# Patient Record
Sex: Male | Born: 1995 | Race: Black or African American | Hispanic: No | Marital: Single | Smoking: Never smoker
Health system: Southern US, Community
[De-identification: ages and names within clinical notes are randomized; demographics above are authoritative.]

## PROBLEM LIST (undated history)

## (undated) DIAGNOSIS — I10 Essential (primary) hypertension: Secondary | ICD-10-CM

## (undated) DIAGNOSIS — M93262 Osteochondritis dissecans, left knee: Secondary | ICD-10-CM

## (undated) HISTORY — PX: TONSILLECTOMY: SUR1361

---
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
## 2016-05-08 IMAGING — MR MRI OF THE LEFT KNEE WITHOUT CONTRAST
5 series · 36 of 40 positions shown · non-contrast
Comparison: 05/21/2010

CLINICAL DATA: No known injury. Feels like something is moving
under the skin.

EXAM:
MRI OF THE LEFT KNEE WITHOUT CONTRAST
TECHNIQUE: Multiplanar, multisequence MR imaging of the knee was performed. No
intravenous contrast was administered.

[Series 3: PD fat-sat · axial · 3.0mm · 0.50mm/px · z∈[-67,+62]mm · 11 of 40 slices shown (1 of 3)]
[im 1/40]
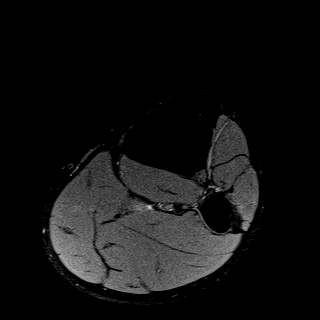
[im 4/40]
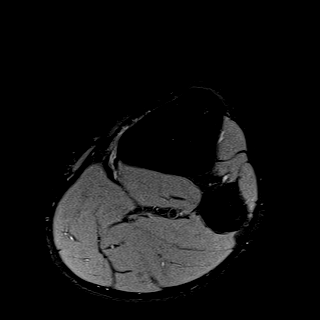
[im 8/40]
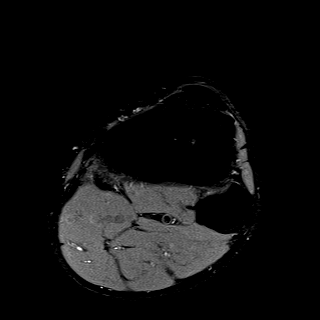
[im 12/40]
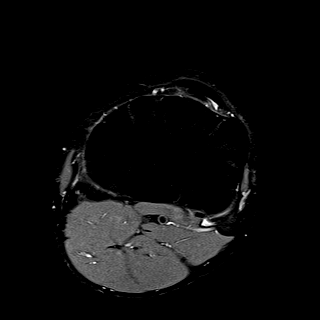
[im 16/40]
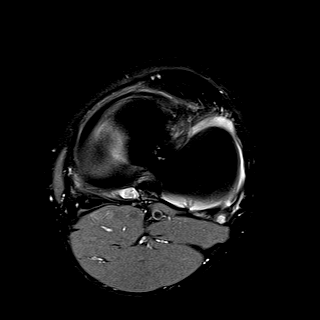
[im 20/40]
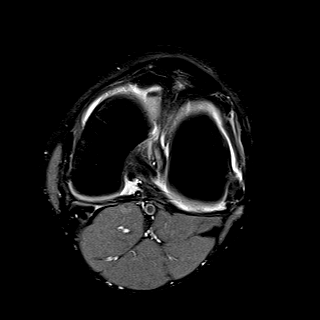
[im 24/40]
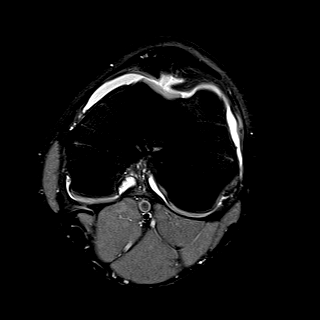
[im 28/40]
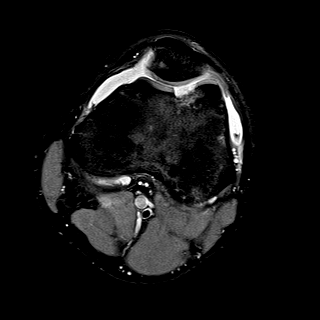
[im 32/40]
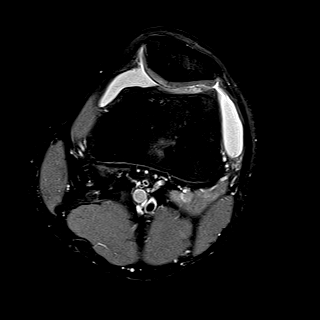
[im 36/40]
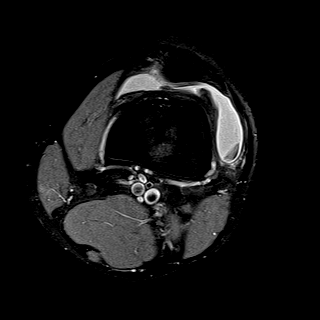
[im 40/40]
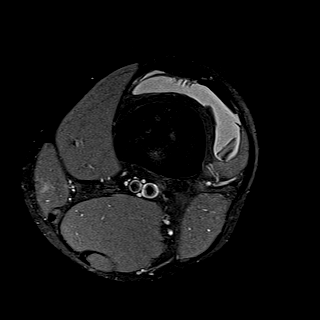

[Series 4: T1 · coronal · 3.0mm · 0.50mm/px · 3 of 30 slices shown]
[im 1/30]
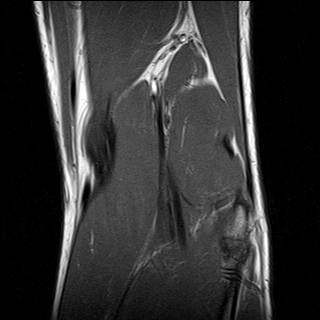
[im 5/30]
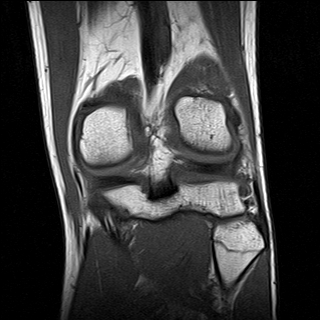
[im 10/30]
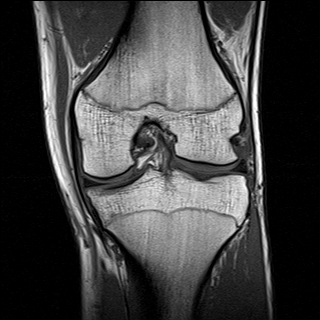

[Series 5: T2 fat-sat · coronal · 3.0mm · 0.31mm/px · 7 of 30 slices shown]
[im 1/30]
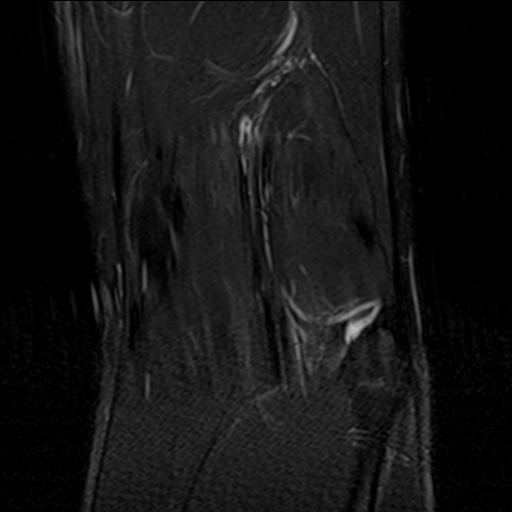
[im 5/30]
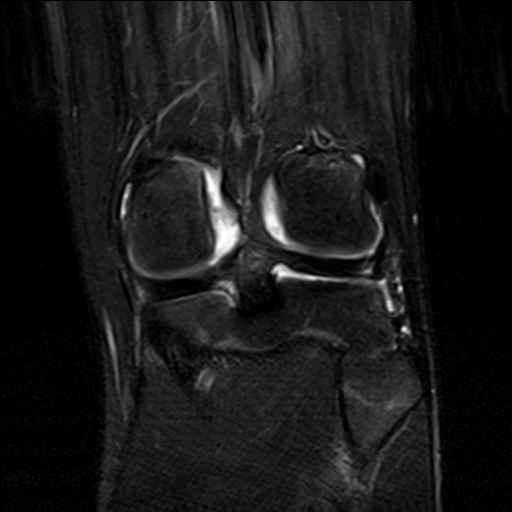
[im 10/30]
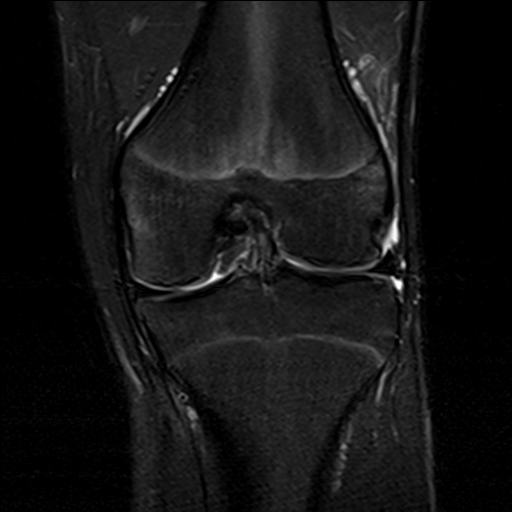
[im 15/30]
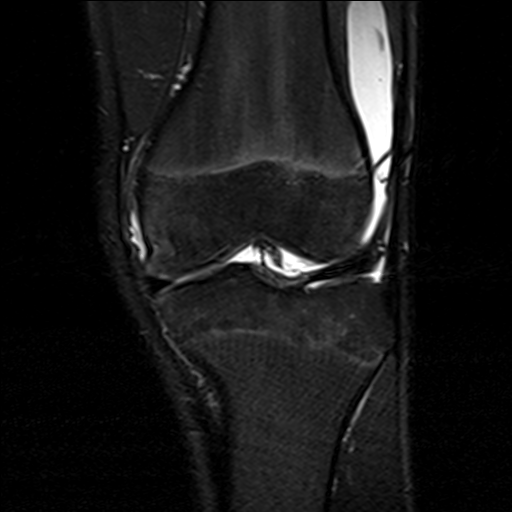
[im 20/30]
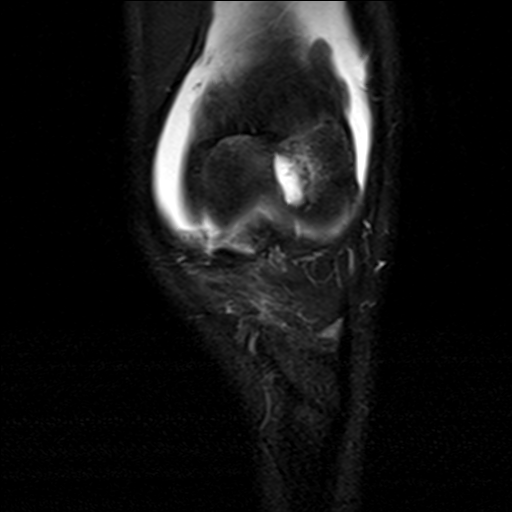
[im 25/30]
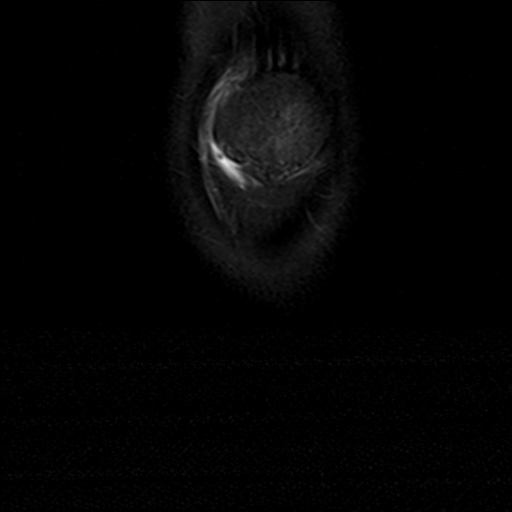
[im 30/30]
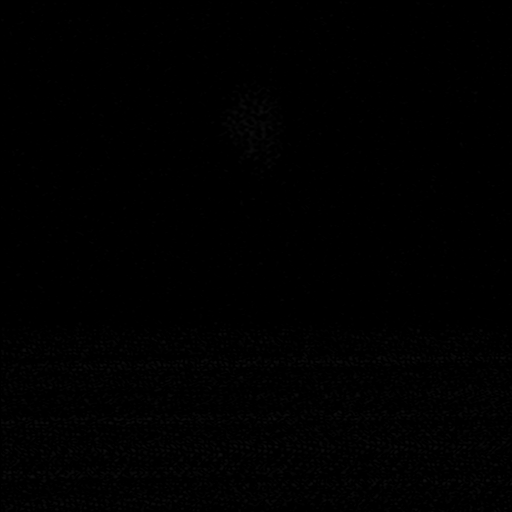

[Series 6: PD fat-sat · coronal · 3.0mm · 0.50mm/px · 7 of 30 slices shown (2 of 3)]
[im 1/30]
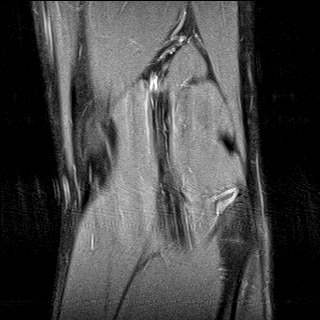
[im 5/30]
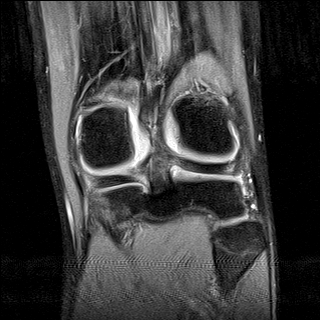
[im 10/30]
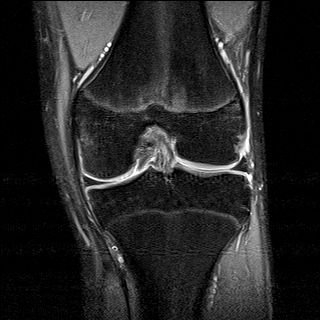
[im 15/30]
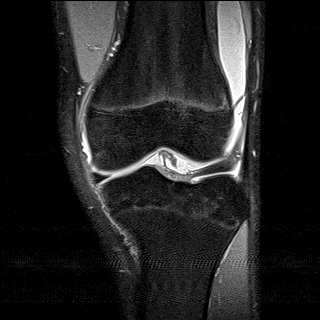
[im 20/30]
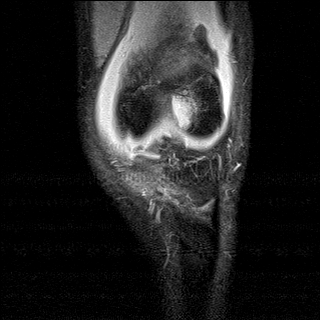
[im 25/30]
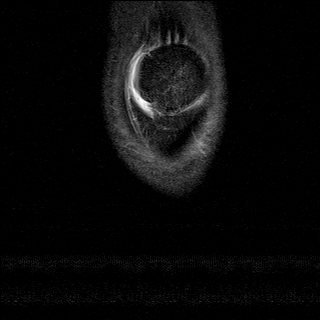
[im 30/30]
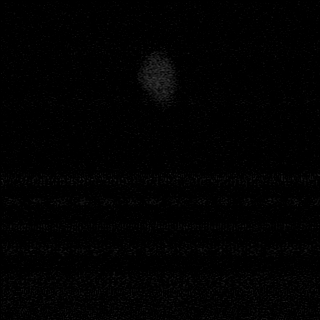

[Series 7: PD fat-sat · sagittal · 3.0mm · 0.50mm/px · 8 of 33 slices shown (3 of 3)]
[im 1/33]
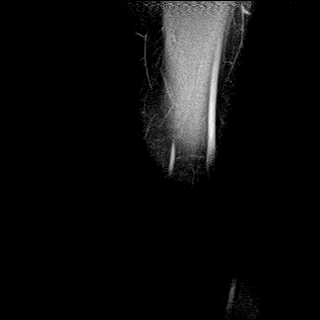
[im 5/33]
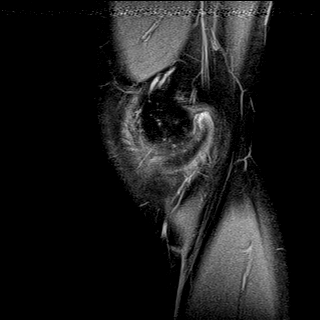
[im 10/33]
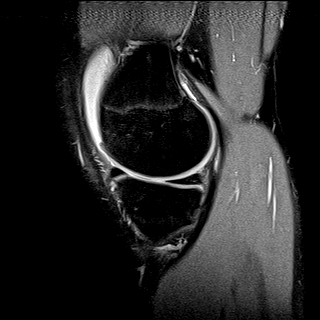
[im 14/33]
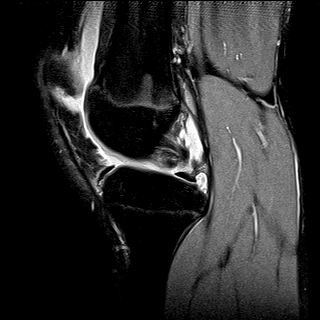
[im 19/33]
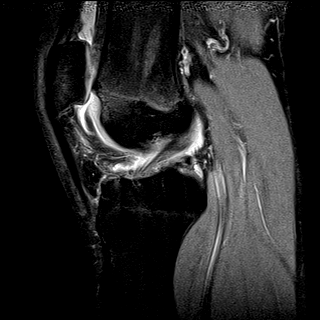
[im 23/33]
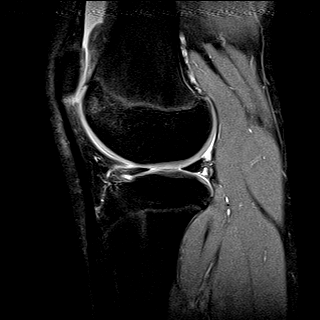
[im 28/33]
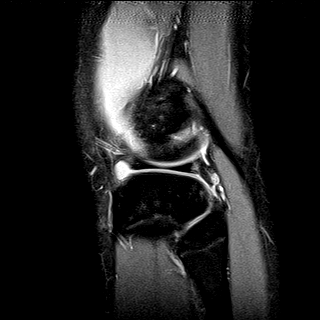
[im 33/33]
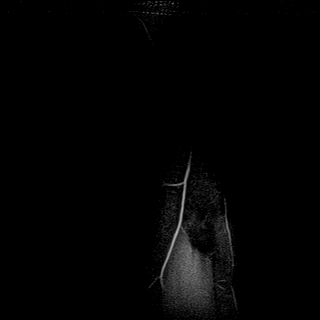

[36 of 40 positions shown; findings below may reference images not displayed]

FINDINGS: MENISCI

Medial meniscus:  Intact.

Lateral meniscus:  Intact.

LIGAMENTS

Cruciates:  Intact ACL and PCL.

Collaterals: Medial collateral ligament is intact. Lateral
collateral ligament complex is intact.

CARTILAGE

Patellofemoral: 7 x 13 x 20 mm displaced osteochondral defect
involving the lateral trochlea with mild surrounding marrow edema.
The fragment is displaced and located in the anterolateral superior
joint space.

Medial:  No chondral defect.

Lateral:  No chondral defect.

Joint: Large joint effusion. Mild edema in Hoffa's fat. No plical
thickening.

Popliteal Fossa:  No Baker cyst.  Intact popliteus tendon.

Extensor Mechanism:  Intact.

Bones:  No other focal marrow signal abnormality.
IMPRESSION: 1. Displaced 7 x 13 x 20 mm osteochondral defect involving the
lateral trochlea with the displaced fragment located in the
anterolateral superior joint space.

## 2019-01-05 ENCOUNTER — Other Ambulatory Visit: Payer: Self-pay | Admitting: Sports Medicine

## 2019-01-05 DIAGNOSIS — G8929 Other chronic pain: Secondary | ICD-10-CM

## 2019-01-05 DIAGNOSIS — M25561 Pain in right knee: Principal | ICD-10-CM

## 2019-01-25 ENCOUNTER — Ambulatory Visit
Admission: RE | Admit: 2019-01-25 | Discharge: 2019-01-25 | Disposition: A | Discharge status: Home / Self Care | Payer: No Typology Code available for payment source | Source: Ambulatory Visit | Attending: Sports Medicine | Admitting: Sports Medicine

## 2019-01-25 DIAGNOSIS — G8929 Other chronic pain: Secondary | ICD-10-CM | POA: Insufficient documentation

## 2019-01-25 DIAGNOSIS — M25561 Pain in right knee: Secondary | ICD-10-CM | POA: Diagnosis not present

## 2019-03-25 ENCOUNTER — Other Ambulatory Visit
Admission: RE | Admit: 2019-03-25 | Discharge: 2019-03-25 | Disposition: A | Discharge status: Home / Self Care | Payer: No Typology Code available for payment source | Source: Ambulatory Visit | Attending: Surgery | Admitting: Surgery

## 2019-03-25 DIAGNOSIS — Z1159 Encounter for screening for other viral diseases: Secondary | ICD-10-CM | POA: Diagnosis not present

## 2019-03-26 LAB — NOVEL CORONAVIRUS, NAA (HOSP ORDER, SEND-OUT TO REF LAB; TAT 18-24 HRS): SARS-CoV-2, NAA: NOT DETECTED

## 2019-03-28 ENCOUNTER — Encounter: Payer: Self-pay | Admitting: *Deleted

## 2019-03-28 ENCOUNTER — Other Ambulatory Visit: Payer: Self-pay

## 2019-03-30 ENCOUNTER — Encounter
Admission: RE | Disposition: A | Discharge status: Home / Self Care | Payer: Self-pay | Source: Home / Self Care | Attending: Surgery

## 2019-03-30 ENCOUNTER — Ambulatory Visit: Payer: No Typology Code available for payment source | Admitting: Anesthesiology

## 2019-03-30 ENCOUNTER — Ambulatory Visit
Admission: RE | Admit: 2019-03-30 | Discharge: 2019-03-30 | Disposition: A | Discharge status: Home / Self Care | Payer: No Typology Code available for payment source | Attending: Surgery | Admitting: Surgery

## 2019-03-30 DIAGNOSIS — S83241A Other tear of medial meniscus, current injury, right knee, initial encounter: Secondary | ICD-10-CM | POA: Diagnosis present

## 2019-03-30 DIAGNOSIS — M238X1 Other internal derangements of right knee: Secondary | ICD-10-CM | POA: Diagnosis not present

## 2019-03-30 HISTORY — PX: KNEE ARTHROSCOPY WITH MEDIAL MENISECTOMY: SHX5651

## 2019-03-30 SURGERY — ARTHROSCOPY, KNEE, WITH MEDIAL MENISCECTOMY
Anesthesia: General | Site: Knee | Laterality: Right

## 2019-03-30 MED ORDER — DEXTROSE 5 % IV SOLN
2000.0000 mg | Freq: Once | INTRAVENOUS | Status: AC
  Administered 2019-03-30: 13:00:00 2000 mg via INTRAVENOUS

## 2019-03-30 MED ORDER — ACETAMINOPHEN 160 MG/5ML PO SOLN: 325.0000 mg | ORAL | Status: DC | PRN

## 2019-03-30 MED ORDER — GLYCOPYRROLATE 0.2 MG/ML IJ SOLN
INTRAMUSCULAR | Status: DC | PRN
  Administered 2019-03-30: 0.1 mg via INTRAVENOUS

## 2019-03-30 MED ORDER — ONDANSETRON HCL 4 MG/2ML IJ SOLN: 4.0000 mg | Freq: Once | INTRAMUSCULAR | Status: DC | PRN

## 2019-03-30 MED ORDER — LIDOCAINE HCL (CARDIAC) PF 100 MG/5ML IV SOSY
PREFILLED_SYRINGE | INTRAVENOUS | Status: DC | PRN
  Administered 2019-03-30: 40 mg via INTRATRACHEAL

## 2019-03-30 MED ORDER — FENTANYL CITRATE (PF) 100 MCG/2ML IJ SOLN
INTRAMUSCULAR | Status: DC | PRN
  Administered 2019-03-30: 100 ug via INTRAVENOUS

## 2019-03-30 MED ORDER — SCOPOLAMINE 1 MG/3DAYS TD PT72
1.0000 | MEDICATED_PATCH | Freq: Once | TRANSDERMAL | Status: DC
  Administered 2019-03-30: 1.5 mg via TRANSDERMAL

## 2019-03-30 MED ORDER — LIDOCAINE-EPINEPHRINE 1 %-1:100000 IJ SOLN
INTRAMUSCULAR | Status: DC | PRN
  Administered 2019-03-30: 25 mL
  Administered 2019-03-30: 20 mL

## 2019-03-30 MED ORDER — OXYCODONE HCL 5 MG/5ML PO SOLN: 5.0000 mg | Freq: Once | ORAL | Status: DC | PRN

## 2019-03-30 MED ORDER — OXYCODONE HCL 5 MG PO TABS: 5.0000 mg | ORAL_TABLET | ORAL | 0 refills | Status: DC | PRN

## 2019-03-30 MED ORDER — ACETAMINOPHEN 325 MG PO TABS: 325.0000 mg | ORAL_TABLET | ORAL | Status: DC | PRN

## 2019-03-30 MED ORDER — LACTATED RINGERS IV SOLN
INTRAVENOUS | Status: DC
  Administered 2019-03-30 (×2): via INTRAVENOUS

## 2019-03-30 MED ORDER — BUPIVACAINE HCL (PF) 0.5 % IJ SOLN
INTRAMUSCULAR | Status: DC | PRN
  Administered 2019-03-30: 25 mL

## 2019-03-30 MED ORDER — DEXAMETHASONE SODIUM PHOSPHATE 4 MG/ML IJ SOLN
INTRAMUSCULAR | Status: DC | PRN
  Administered 2019-03-30: 4 mg via INTRAVENOUS

## 2019-03-30 MED ORDER — OXYCODONE HCL 5 MG PO TABS: 5.0000 mg | ORAL_TABLET | Freq: Once | ORAL | Status: DC | PRN

## 2019-03-30 MED ORDER — ONDANSETRON HCL 4 MG/2ML IJ SOLN
INTRAMUSCULAR | Status: DC | PRN
  Administered 2019-03-30: 4 mg via INTRAVENOUS

## 2019-03-30 MED ORDER — FENTANYL CITRATE (PF) 100 MCG/2ML IJ SOLN: 25.0000 ug | INTRAMUSCULAR | Status: DC | PRN

## 2019-03-30 MED ORDER — MIDAZOLAM HCL 5 MG/5ML IJ SOLN
INTRAMUSCULAR | Status: DC | PRN
  Administered 2019-03-30: 2 mg via INTRAVENOUS

## 2019-03-30 MED ORDER — PROPOFOL 10 MG/ML IV BOLUS
INTRAVENOUS | Status: DC | PRN
  Administered 2019-03-30: 160 mg via INTRAVENOUS

## 2019-03-30 SURGICAL SUPPLY — 32 items
ADAPTER IRRIG TUBE 2 SPIKE SOL (ADAPTER) ×3 IMPLANT
BANDAGE ACE 6X5 VEL STRL LF (GAUZE/BANDAGES/DRESSINGS) ×3 IMPLANT
BLADE FULL RADIUS 3.5 (BLADE) ×3 IMPLANT
BLADE SURG SZ11 CARB STEEL (BLADE) ×3 IMPLANT
BNDG COHESIVE 4X5 TAN STRL (GAUZE/BANDAGES/DRESSINGS) ×3 IMPLANT
BNDG ESMARK 6X12 TAN STRL LF (GAUZE/BANDAGES/DRESSINGS) ×3 IMPLANT
BUR ACROMIONIZER 4.0 (BURR) ×3 IMPLANT
CHLORAPREP W/TINT 26ML (MISCELLANEOUS) ×3 IMPLANT
COVER LIGHT HANDLE UNIVERSAL (MISCELLANEOUS) ×6 IMPLANT
CUFF TOURN SGL QUICK 30 (MISCELLANEOUS) ×2
CUFF TRNQT CYL LO 30X4X (MISCELLANEOUS) ×1 IMPLANT
DRAPE IMP U-DRAPE 54X76 (DRAPES) ×3 IMPLANT
DRAPE U-SHAPE 48X52 POLY STRL (PACKS) ×3 IMPLANT
GAUZE SPONGE 4X4 12PLY STRL (GAUZE/BANDAGES/DRESSINGS) ×3 IMPLANT
GLOVE BIO SURGEON STRL SZ7.5 (GLOVE) ×3 IMPLANT
GLOVE BIOGEL PI IND STRL 8 (GLOVE) ×1 IMPLANT
GLOVE BIOGEL PI INDICATOR 8 (GLOVE) ×2
GOWN STRL REUS W/ TWL LRG LVL3 (GOWN DISPOSABLE) ×1 IMPLANT
GOWN STRL REUS W/ TWL XL LVL3 (GOWN DISPOSABLE) ×1 IMPLANT
GOWN STRL REUS W/TWL LRG LVL3 (GOWN DISPOSABLE) ×2
GOWN STRL REUS W/TWL XL LVL3 (GOWN DISPOSABLE) ×2
IV LACTATED RINGER IRRG 3000ML (IV SOLUTION) ×4
IV LR IRRIG 3000ML ARTHROMATIC (IV SOLUTION) ×2 IMPLANT
KIT TURNOVER KIT A (KITS) ×3 IMPLANT
MAT ABSORB  FLUID 56X50 GRAY (MISCELLANEOUS) ×2
MAT ABSORB FLUID 56X50 GRAY (MISCELLANEOUS) ×1 IMPLANT
NEEDLE HYPO 22GX1.5 SAFETY (NEEDLE) ×3 IMPLANT
NEPTUNE MANIFOLD (MISCELLANEOUS) ×3 IMPLANT
NS IRRIG 500ML POUR BTL (IV SOLUTION) ×3 IMPLANT
PACK ARTHROSCOPY KNEE (MISCELLANEOUS) ×3 IMPLANT
SUT PROLENE 4 0 PS 2 18 (SUTURE) ×3 IMPLANT
TUBING ARTHRO INFLOW-ONLY STRL (TUBING) ×3 IMPLANT

## 2019-03-30 NOTE — Anesthesia Preprocedure Evaluation (Signed)
Anesthesia Evaluation  Patient identified by MRN, date of birth, ID band Patient awake    Reviewed: Allergy & Precautions, H&P , NPO status , Patient's Chart, lab work & pertinent test results  Airway Mallampati: I  TM Distance: >3 FB Neck ROM: full    Dental no notable dental hx.    Pulmonary    Pulmonary exam normal breath sounds clear to auscultation       Cardiovascular Normal cardiovascular exam Rhythm:regular Rate:Normal     Neuro/Psych    GI/Hepatic   Endo/Other    Renal/GU      Musculoskeletal   Abdominal   Peds  Hematology   Anesthesia Other Findings   Reproductive/Obstetrics                             Anesthesia Physical Anesthesia Plan  ASA: I  Anesthesia Plan: General LMA   Post-op Pain Management:    Induction:   PONV Risk Score and Plan: 2 and Midazolam, Scopolamine patch - Pre-op, Ondansetron, Dexamethasone and Treatment may vary due to age or medical condition  Airway Management Planned:   Additional Equipment:   Intra-op Plan:   Post-operative Plan:   Informed Consent: I have reviewed the patients History and Physical, chart, labs and discussed the procedure including the risks, benefits and alternatives for the proposed anesthesia with the patient or authorized representative who has indicated his/her understanding and acceptance.       Plan Discussed with: CRNA  Anesthesia Plan Comments:         Anesthesia Quick Evaluation

## 2019-03-30 NOTE — Transfer of Care (Signed)
Immediate Anesthesia Transfer of Care Note  Patient: Clinton Ramirez  Procedure(s) Performed: KNEE ARTHROSCOPY WITH PARTIAL MEDIAL MENISECTOMYAND REPAIR OF ARTICULAR CARTILAGE LESION OF FEMORAL TROCHLEA THROUGH ARTHROTOMY (Right Knee)  Patient Location: PACU  Anesthesia Type: General LMA  Level of Consciousness: awake, alert  and patient cooperative  Airway and Oxygen Therapy: Patient Spontanous Breathing and Patient connected to supplemental oxygen  Post-op Assessment: Post-op Vital signs reviewed, Patient's Cardiovascular Status Stable, Respiratory Function Stable, Patent Airway and No signs of Nausea or vomiting  Post-op Vital Signs: Reviewed and stable  Complications: No apparent anesthesia complications

## 2019-03-30 NOTE — H&P (Signed)
Paper H&P to be scanned into permanent record. H&P reviewed and patient re-examined. No changes. 

## 2019-03-30 NOTE — Anesthesia Procedure Notes (Signed)
Procedure Name: LMA Insertion Date/Time: 03/30/2019 1:50 PM Performed by: Jimmy Picket, CRNA Pre-anesthesia Checklist: Patient identified, Emergency Drugs available, Suction available, Timeout performed and Patient being monitored Patient Re-evaluated:Patient Re-evaluated prior to induction Oxygen Delivery Method: Circle system utilized Preoxygenation: Pre-oxygenation with 100% oxygen Induction Type: IV induction LMA: LMA inserted LMA Size: 4.0 Number of attempts: 1 Placement Confirmation: positive ETCO2 and breath sounds checked- equal and bilateral Tube secured with: Tape

## 2019-03-30 NOTE — Anesthesia Postprocedure Evaluation (Signed)
Anesthesia Post Note  Patient: Clinton Ramirez  Procedure(s) Performed: KNEE ARTHROSCOPY WITH PARTIAL MEDIAL MENISECTOMYAND REPAIR OF ARTICULAR CARTILAGE LESION OF FEMORAL TROCHLEA THROUGH ARTHROTOMY (Right Knee)  Patient location during evaluation: PACU Anesthesia Type: General Level of consciousness: awake and alert and oriented Pain management: satisfactory to patient Vital Signs Assessment: post-procedure vital signs reviewed and stable Respiratory status: spontaneous breathing, nonlabored ventilation and respiratory function stable Cardiovascular status: blood pressure returned to baseline and stable Postop Assessment: Adequate PO intake and No signs of nausea or vomiting Anesthetic complications: no    Cherly [place]

## 2019-03-30 NOTE — Discharge Instructions (Signed)
General Anesthesia, Adult, Care After °This sheet gives you information about how to care for yourself after your procedure. Your health care provider may also give you more specific instructions. If you have problems or questions, contact your health care provider. °What can I expect after the procedure? °After the procedure, the following side effects are common: °· Pain or discomfort at the IV site. °· Nausea. °· Vomiting. °· Sore throat. °· Trouble concentrating. °· Feeling cold or chills. °· Weak or tired. °· Sleepiness and fatigue. °· Soreness and body aches. These side effects can affect parts of the body that were not involved in surgery. °Follow these instructions at home: ° °For at least 24 hours after the procedure: °· Have a responsible adult stay with you. It is important to have someone help care for you until you are awake and alert. °· Rest as needed. °· Do not: °? Participate in activities in which you could fall or become injured. °? Drive. °? Use heavy machinery. °? Drink alcohol. °? Take sleeping pills or medicines that cause drowsiness. °? Make important decisions or sign legal documents. °? Take care of children on your own. °Eating and drinking °· Follow any instructions from your health care provider about eating or drinking restrictions. °· When you feel hungry, start by eating small amounts of foods that are soft and easy to digest (bland), such as toast. Gradually return to your regular diet. °· Drink enough fluid to keep your urine pale yellow. °· If you vomit, rehydrate by drinking water, juice, or clear broth. °General instructions °· If you have sleep apnea, surgery and certain medicines can increase your risk for breathing problems. Follow instructions from your health care provider about wearing your sleep device: °? Anytime you are sleeping, including during daytime naps. °? While taking prescription pain medicines, sleeping medicines, or medicines that make you drowsy. °· Return to  your normal activities as told by your health care provider. Ask your health care provider what activities are safe for you. °· Take over-the-counter and prescription medicines only as told by your health care provider. °· If you smoke, do not smoke without supervision. °· Keep all follow-up visits as told by your health care provider. This is important. °Contact a health care provider if: °· You have nausea or vomiting that does not get better with medicine. °· You cannot eat or drink without vomiting. °· You have pain that does not get better with medicine. °· You are unable to pass urine. °· You develop a skin rash. °· You have a fever. °· You have redness around your IV site that gets worse. °Get help right away if: °· You have difficulty breathing. °· You have chest pain. °· You have blood in your urine or stool, or you vomit blood. °Summary °· After the procedure, it is common to have a sore throat or nausea. It is also common to feel tired. °· Have a responsible adult stay with you for the first 24 hours after general anesthesia. It is important to have someone help care for you until you are awake and alert. °· When you feel hungry, start by eating small amounts of foods that are soft and easy to digest (bland), such as toast. Gradually return to your regular diet. °· Drink enough fluid to keep your urine pale yellow. °· Return to your normal activities as told by your health care provider. Ask your health care provider what activities are safe for you. °This information is not   intended to replace advice given to you by your health care provider. Make sure you discuss any questions you have with your health care provider. °Document Released: 02/09/2001 Document Revised: 06/19/2017 Document Reviewed: 06/19/2017 °Elsevier Interactive Patient Education © 2019 [employer] ° ° °Orthopedic discharge instructions: °Keep dressing dry and intact.  °May shower after dressing changed on post-op day #4 (Sunday).    °Cover sutures with Band-Aids after drying off. °Apply ice frequently to knee. °Take ibuprofen 600-800 mg TID with meals for 7-10 days, then as necessary. °Take pain medication as prescribed or ES Tylenol when needed.  °May weight-bear as tolerated - use crutches or walker as needed. °Follow-up in 10-14 days or as scheduled. °

## 2019-03-30 NOTE — Op Note (Signed)
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  Postoperative diagnosis:   Focal full-thickness articular cartilage defect of lateral femoral cochlea, right knee.  Procedure:   Arthroscopic debridement with abrasion chondroplasty and microfracture of focal full-thickness articular cartilage defect of the lateral femoral trochlea, right knee.  Surgeon:   Maryagnes Amos, M.D.  Anesthesia:   General LMA.  Findings:   As above.  There was an area of full-thickness articular cartilage loss involving the lateral portion of the femoral trochlea and measuring approximately 0.7 x 1.3 cm.  The remaining portions of the articular surface all were in excellent condition, as were the anterior and posterior cruciate ligaments.  The medial and lateral menisci both were in excellent condition as well.  Complications:   None.  EBL:   <5 cc.  Total fluids:   1000 cc of crystalloid.  Tourniquet time:   None  Drains:   None  Closure:   4-0 Prolene interrupted sutures.  Brief clinical note:   The patient is a 23 year old male with an approximately 6 to 10-month history of right knee pain. His symptoms have persisted despite medications, activity modification, etc. His history and examination are consistent with a possible medial meniscus tear. An MRI scan demonstrated a focal full-thickness articular cartilage defect involving the lateral portion of the femoral trochlea, as well as a possible small medial meniscus tear. The patient presents at this time for arthroscopy, debridement, microfracture versus allograft de novo cartilage transplantation, and possible partial medial meniscectomy.  Procedure:   The patient was brought into the operating room and lain in the supine position. After adequate general laryngeal mask anesthesia was obtained, a timeout was  performed to verify the appropriate side. The patient's right knee was injected sterilely using a solution of 25 cc of 1% lidocaine with epinephrine and 25 cc of 0.5% Sensorcaine. The right lower extremity was prepped with ChloraPrep solution before being draped sterilely. Preoperative antibiotics were administered. The expected portal sites were injected with 1% lidocaine with epinephrine before the camera was placed in the anterolateral portal and instrumentation performed through the anteromedial portal. The knee was sequentially examined beginning in the suprapatellar pouch, then progressing to the patellofemoral space, the medial gutter compartment, the notch, and finally the lateral compartment and gutter. The findings were as described above. Abundant reactive synovial tissues anteriorly were debrided using the full-radius resector in order to improve visualization.   The area of articular cartilage damage was probed and measured. It was felt that the area was small enough to permit successful microfracture. Therefore, the edges and base of the lesion were carefully curetted using a ring curette before the subchondral bone was perforated numerous times using an arthroscopic awl, maintaining approximately 4 mm of distance between each perforation. The inflow was briefly stopped to verify that adequate bleeding into the defect had been achieved. Once this was confirmed, the instruments were removed from the joint after suctioning the excess fluid.   The portal sites were closed using 4-0 Prolene interrupted sutures before a sterile bulky dressing was applied to the knee. The patient was then awakened, extubated, and returned to the recovery room in satisfactory condition after tolerating the procedure well.

## 2019-03-31 ENCOUNTER — Encounter: Payer: Self-pay | Admitting: Surgery

## 2019-07-19 ENCOUNTER — Other Ambulatory Visit: Payer: Self-pay | Admitting: Surgery

## 2019-07-19 DIAGNOSIS — M94261 Chondromalacia, right knee: Secondary | ICD-10-CM

## 2019-07-28 ENCOUNTER — Ambulatory Visit
Admission: RE | Admit: 2019-07-28 | Discharge: 2019-07-28 | Disposition: A | Discharge status: Home / Self Care | Payer: 59 | Source: Ambulatory Visit | Attending: Surgery | Admitting: Surgery

## 2019-07-28 ENCOUNTER — Other Ambulatory Visit: Payer: Self-pay

## 2019-07-28 DIAGNOSIS — M94261 Chondromalacia, right knee: Secondary | ICD-10-CM | POA: Diagnosis present

## 2020-03-30 ENCOUNTER — Other Ambulatory Visit: Payer: Self-pay | Admitting: Family Medicine

## 2020-03-30 ENCOUNTER — Telehealth (HOSPITAL_COMMUNITY): Payer: Self-pay | Admitting: Family Medicine

## 2020-03-30 DIAGNOSIS — N5082 Scrotal pain: Secondary | ICD-10-CM

## 2020-03-30 NOTE — Telephone Encounter (Signed)
03/30/20~s/w Grandmother/Doris, stated he is away to College in [place]. L/M w # to call. MF

## 2020-04-03 ENCOUNTER — Other Ambulatory Visit: Payer: Self-pay

## 2020-04-03 ENCOUNTER — Ambulatory Visit
Admission: RE | Admit: 2020-04-03 | Discharge: 2020-04-03 | Disposition: A | Discharge status: Home / Self Care | Payer: 59 | Source: Ambulatory Visit | Attending: Family Medicine | Admitting: Family Medicine

## 2020-04-03 DIAGNOSIS — N5082 Scrotal pain: Secondary | ICD-10-CM | POA: Diagnosis present

## 2020-04-04 ENCOUNTER — Other Ambulatory Visit: Payer: Self-pay | Admitting: Family Medicine

## 2020-04-04 DIAGNOSIS — K4020 Bilateral inguinal hernia, without obstruction or gangrene, not specified as recurrent: Secondary | ICD-10-CM

## 2020-04-04 DIAGNOSIS — N5082 Scrotal pain: Secondary | ICD-10-CM

## 2020-04-11 ENCOUNTER — Ambulatory Visit: Admission: RE | Admit: 2020-04-11 | Payer: 59 | Source: Ambulatory Visit

## 2020-04-17 ENCOUNTER — Ambulatory Visit: Payer: 59

## 2020-04-17 ENCOUNTER — Ambulatory Visit: Payer: Self-pay | Admitting: General Surgery

## 2020-04-17 NOTE — H&P (Signed)
PATIENT PROFILE: Clinton Ramirez is a 24 y.o. male who presents to the Clinic for consultation at the request of Dr. Netty Starring for evaluation of bilateral inguinal hernia.  PCP:  Dion Body, MD  HISTORY OF PRESENT ILLNESS: Clinton Ramirez reports having pain on the groins since couple of months ago.  The patient reported that the pain change from right to left groin.  Most of the time is on the right groin but he also had pain in the left groin.  The pains radiate to the testicles.  Aggravating factor is working out at [employer].  There is no alleviating factor.  Denies any constipation.  Denies any significant bulging.  The patient was evaluated by primary care physician who ordered an ultrasound of the testicles.  Bilateral testicular ultrasound shows bilateral fat-containing hernias on groins.  Please were seen with Valsalva maneuver.  I personally evaluated the images.   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      GENERAL REVIEW OF SYSTEMS:   General ROS: negative for - chills, fatigue, fever, weight gain or weight loss Allergy and Immunology ROS: negative for - hives  Hematological and Lymphatic ROS: negative for - bleeding problems or bruising, negative for palpable nodes Endocrine ROS: negative for - heat or cold intolerance, hair changes Respiratory ROS: negative for - cough, shortness of breath or wheezing Cardiovascular ROS: no chest pain or palpitations GI ROS: negative for nausea, vomiting, abdominal pain, diarrhea, constipation Musculoskeletal ROS: Positive for - joint swelling or muscle pain Neurological ROS: negative for - confusion, syncope Dermatological ROS: negative for pruritus and rash Psychiatric: negative for anxiety, depression, difficulty sleeping and  memory loss  MEDICATIONS: Current Medications        Current Outpatient Medications  Medication Sig Dispense Refill  . escitalopram oxalate (LEXAPRO) 10 MG tablet Take 1 tablet by mouth once daily    . hydroCHLOROthiazide (HYDRODIURIL) 12.5 MG tablet TAKE 1 TABLET BY MOUTH EVERY DAY 90 tablet 0   No current facility-administered medications for this visit.      ALLERGIES: Patient has no known allergies.  PAST MEDICAL HISTORY:     Past Medical History:  Diagnosis Date  . Hypertension     PAST SURGICAL HISTORY:      Past Surgical History:  Procedure Laterality Date  . Arthroscopic debridement and mini open repair of osteochondral lesion of the femoral trochlea, left knee Left 12/12/14  . Focal full Thickness articular cartilage defect of lateral femoral cochlea,right knee Right 03/30/2019   Dr.poggi   . Left knee arthroscopy with screw removal  08.17.16   Dr.Poggi      FAMILY HISTORY: Family History  Problem Relation Age of Onset  . No Known Problems Mother   . Stroke Father   . No Known Problems Brother   . No Known Problems Brother      SOCIAL HISTORY: Social History          Socioeconomic History  . Marital status: Single    Spouse name: Not on file  . Number of children: 0  . Years of education: 15+  . Highest education level: Not on file  Occupational History  . Occupation: Teacher, adult education    Comment: Works part-time at [employer]  .  Smoking status: Never Smoker  . Smokeless tobacco: Never Used  Vaping Use  . Vaping Use: Never used  Substance and Sexual Activity  . Alcohol use: No    Alcohol/week: 0.0 standard drinks  . Drug use: No  . Sexual activity: Yes    Partners: Female  Other Topics Concern  . 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 Difficulty of Paying Living Expenses:   Food Insecurity:   . Worried About Charity fundraiser in the Last Year:   . Arboriculturist in the Last Year:   Transportation Needs:   . Film/video editor (Medical):   Marland Kitchen Lack of Transportation (Non-Medical):       PHYSICAL EXAM:    Vitals:   04/17/20 1039  BP: (!) 158/95  Pulse: 68   Body mass index is 27.63 kg/m. Weight: 92.5 kg (204 lb)   GENERAL: Alert, active, oriented x3  HEENT: Pupils equal reactive to light. Extraocular movements are intact. Sclera clear. Palpebral conjunctiva normal red color.Pharynx clear.  NECK: Supple with no palpable mass and no adenopathy.  LUNGS: Sound clear with no rales rhonchi or wheezes.  HEART: Regular rhythm S1 and S2 without murmur.  ABDOMEN: Soft and depressible, nontender with no palpable mass, no hepatomegaly.   EXTREMITIES: Well-developed well-nourished symmetrical with no dependent edema.  NEUROLOGICAL: Awake alert oriented, facial expression symmetrical, moving all extremities.  REVIEW OF DATA: I have reviewed the following data today:      Office Visit on 02/06/2020  Component Date Value  . Glucose 02/06/2020 94   . Sodium 02/06/2020 139   . Potassium 02/06/2020 4.6   . Chloride 02/06/2020 102   . Carbon Dioxide (CO2) 02/06/2020 29.4   . Urea Nitrogen (BUN) 02/06/2020 22   . Creatinine 02/06/2020 0.9   . Glomerular Filtration Ra* 02/06/2020 128   . Calcium 02/06/2020 10.1   . AST  02/06/2020 24   . ALT  02/06/2020 25   . Alk Phos (alkaline Phosp* 02/06/2020 83   . Albumin 02/06/2020 4.6   . Bilirubin, Total 02/06/2020 0.5   . Protein, Total 02/06/2020 7.2   . A/G Ratio 02/06/2020 1.8   . Hemoglobin A1C 02/06/2020 5.5   . Average Blood Glucose (C* 02/06/2020 111   . WBC (White Blood Cell Co* 02/06/2020 6.2   . RBC (Red Blood Cell Coun* 02/06/2020  4.78   . Hemoglobin 02/06/2020 14.3   . Hematocrit 02/06/2020 43.6   . MCV (Mean Corpuscular Vo* 02/06/2020 91.2   . MCH (Mean Corpuscular He* 02/06/2020 29.9   . MCHC (Mean Corpuscular H* 02/06/2020 32.8   . Platelet Count 02/06/2020 320   . RDW-CV (Red Cell Distrib* 02/06/2020 12.1   . MPV (Mean Platelet Volum* 02/06/2020 9.0*  . Neutrophils 02/06/2020 3.84   . Lymphocytes 02/06/2020 1.87   . Monocytes 02/06/2020 0.35   . Eosinophils 02/06/2020 0.07   . Basophils 02/06/2020 0.01   . Neutrophil % 02/06/2020 62.4   . Lymphocyte % 02/06/2020 30.4   . Monocyte % 02/06/2020 5.7   . Eosinophil % 02/06/2020 1.1   . Basophil% 02/06/2020 0.2   . Immature Granulocyte % 02/06/2020 0.2   . Immature Granulocyte Cou* 02/06/2020  0.01   . Cholesterol, Total 02/06/2020 127   . Triglyceride 02/06/2020 88   . HDL (High Density Lipopr* 02/06/2020 47.3   . LDL Calculated 02/06/2020 62   . VLDL Cholesterol 02/06/2020 18   . Cholesterol/HDL Ratio 02/06/2020 2.7   . HCV Ab - [facility] 02/06/2020 <0.1   . Chlamydia trachomatis, N* 02/06/2020 Negative   . Neisseria gonorrhoeae by* 02/06/2020 Negative   . HIV 1/2 Antibodies 02/06/2020 Negative   . HIV 1 P24 Antigen 02/06/2020 Negative   . RPR - [facility] 02/06/2020 Non Reactive   . Comment: - [facility] 02/06/2020 Comment      ASSESSMENT: Mr. Torti is a 24 y.o. male presenting for consultation for bilateral inguinal hernia.    The patient presents with a symptomatic, reducible inguinal hernia. Patient was oriented about the diagnosis of inguinal hernia and its implication. The patient was oriented about the treatment alternatives (observation vs surgical repair). Due to patient symptoms, repair is recommended. Patient oriented about the surgical procedure, the use of mesh and its risk of complications such as: infection, bleeding, injury to vas deference, vasculature and testicle, injury to bowel or bladder, and chronic pain.  Patient was also  oriented about sports hernia which is tender to hernia but more a weakening of the pelvic floor and inflammation of the tendons and muscles that attach to the pelvic bones.  Both the fat-containing inguinal hernias and the sports hernia are treated with laparoscopic inguinal hernia repair with mesh.  This was discussed with the patient.  The patient reports understood and agreed to proceed.  Non-recurrent bilateral inguinal hernia without obstruction or gangrene [K40.20]  PLAN: 1. Robotic assisted laparoscopic bilateral inguinal hernia repair with mesh (13244 x2) 2.  Avoid taking aspirin 5 days before procedure 3.  Contact us if has any question or concern.   Patient and her mother verbalized understanding, all questions were answered, and were agreeable with the plan outlined above.    Herbert Pun, MD  Electronically signed by Herbert Pun, MD

## 2020-04-17 NOTE — H&P (View-Only) (Signed)
PATIENT PROFILE: Clinton Ramirez is a 24 y.o. male who presents to the Clinic for consultation at the request of Dr. Netty Starring for evaluation of bilateral inguinal hernia.  PCP:  Dion Body, MD  HISTORY OF PRESENT ILLNESS: Clinton Ramirez reports having pain on the groins since couple of months ago.  The patient reported that the pain change from right to left groin.  Most of the time is on the right groin but he also had pain in the left groin.  The pains radiate to the testicles.  Aggravating factor is working out at [employer].  There is no alleviating factor.  Denies any constipation.  Denies any significant bulging.  The patient was evaluated by primary care physician who ordered an ultrasound of the testicles.  Bilateral testicular ultrasound shows bilateral fat-containing hernias on groins.  Please were seen with Valsalva maneuver.  I personally evaluated the images.   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      GENERAL REVIEW OF SYSTEMS:   General ROS: negative for - chills, fatigue, fever, weight gain or weight loss Allergy and Immunology ROS: negative for - hives  Hematological and Lymphatic ROS: negative for - bleeding problems or bruising, negative for palpable nodes Endocrine ROS: negative for - heat or cold intolerance, hair changes Respiratory ROS: negative for - cough, shortness of breath or wheezing Cardiovascular ROS: no chest pain or palpitations GI ROS: negative for nausea, vomiting, abdominal pain, diarrhea, constipation Musculoskeletal ROS: Positive for - joint swelling or muscle pain Neurological ROS: negative for - confusion, syncope Dermatological ROS: negative for pruritus and rash Psychiatric: negative for anxiety, depression, difficulty sleeping and  memory loss  MEDICATIONS: Current Medications        Current Outpatient Medications  Medication Sig Dispense Refill  . escitalopram oxalate (LEXAPRO) 10 MG tablet Take 1 tablet by mouth once daily    . hydroCHLOROthiazide (HYDRODIURIL) 12.5 MG tablet TAKE 1 TABLET BY MOUTH EVERY DAY 90 tablet 0   No current facility-administered medications for this visit.      ALLERGIES: Patient has no known allergies.  PAST MEDICAL HISTORY:     Past Medical History:  Diagnosis Date  . Hypertension     PAST SURGICAL HISTORY:      Past Surgical History:  Procedure Laterality Date  . Arthroscopic debridement and mini open repair of osteochondral lesion of the femoral trochlea, left knee Left 12/12/14  . Focal full Thickness articular cartilage defect of lateral femoral cochlea,right knee Right 03/30/2019   Dr.poggi   . Left knee arthroscopy with screw removal  08.17.16   Dr.Poggi      FAMILY HISTORY: Family History  Problem Relation Age of Onset  . No Known Problems Mother   . Stroke Father   . No Known Problems Brother   . No Known Problems Brother      SOCIAL HISTORY: Social History          Socioeconomic History  . Marital status: Single    Spouse name: Not on file  . Number of children: 0  . Years of education: 15+  . Highest education level: Not on file  Occupational History  . Occupation: Teacher, adult education    Comment: Works part-time at [employer]  .  Smoking status: Never Smoker  . Smokeless tobacco: Never Used  Vaping Use  . Vaping Use: Never used  Substance and Sexual Activity  . Alcohol use: No    Alcohol/week: 0.0 standard drinks  . Drug use: No  . Sexual activity: Yes    Partners: Female  Other Topics Concern  . 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 Difficulty of Paying Living Expenses:   Food Insecurity:   . Worried About Charity fundraiser in the Last Year:   . Arboriculturist in the Last Year:   Transportation Needs:   . Film/video editor (Medical):   Marland Kitchen Lack of Transportation (Non-Medical):       PHYSICAL EXAM:    Vitals:   04/17/20 1039  BP: (!) 158/95  Pulse: 68   Body mass index is 27.63 kg/m. Weight: 92.5 kg (204 lb)   GENERAL: Alert, active, oriented x3  HEENT: Pupils equal reactive to light. Extraocular movements are intact. Sclera clear. Palpebral conjunctiva normal red color.Pharynx clear.  NECK: Supple with no palpable mass and no adenopathy.  LUNGS: Sound clear with no rales rhonchi or wheezes.  HEART: Regular rhythm S1 and S2 without murmur.  ABDOMEN: Soft and depressible, nontender with no palpable mass, no hepatomegaly.   EXTREMITIES: Well-developed well-nourished symmetrical with no dependent edema.  NEUROLOGICAL: Awake alert oriented, facial expression symmetrical, moving all extremities.  REVIEW OF DATA: I have reviewed the following data today:      Office Visit on 02/06/2020  Component Date Value  . Glucose 02/06/2020 94   . Sodium 02/06/2020 139   . Potassium 02/06/2020 4.6   . Chloride 02/06/2020 102   . Carbon Dioxide (CO2) 02/06/2020 29.4   . Urea Nitrogen (BUN) 02/06/2020 22   . Creatinine 02/06/2020 0.9   . Glomerular Filtration Ra* 02/06/2020 128   . Calcium 02/06/2020 10.1   . AST  02/06/2020 24   . ALT  02/06/2020 25   . Alk Phos (alkaline Phosp* 02/06/2020 83   . Albumin 02/06/2020 4.6   . Bilirubin, Total 02/06/2020 0.5   . Protein, Total 02/06/2020 7.2   . A/G Ratio 02/06/2020 1.8   . Hemoglobin A1C 02/06/2020 5.5   . Average Blood Glucose (C* 02/06/2020 111   . WBC (White Blood Cell Co* 02/06/2020 6.2   . RBC (Red Blood Cell Coun* 02/06/2020  4.78   . Hemoglobin 02/06/2020 14.3   . Hematocrit 02/06/2020 43.6   . MCV (Mean Corpuscular Vo* 02/06/2020 91.2   . MCH (Mean Corpuscular He* 02/06/2020 29.9   . MCHC (Mean Corpuscular H* 02/06/2020 32.8   . Platelet Count 02/06/2020 320   . RDW-CV (Red Cell Distrib* 02/06/2020 12.1   . MPV (Mean Platelet Volum* 02/06/2020 9.0*  . Neutrophils 02/06/2020 3.84   . Lymphocytes 02/06/2020 1.87   . Monocytes 02/06/2020 0.35   . Eosinophils 02/06/2020 0.07   . Basophils 02/06/2020 0.01   . Neutrophil % 02/06/2020 62.4   . Lymphocyte % 02/06/2020 30.4   . Monocyte % 02/06/2020 5.7   . Eosinophil % 02/06/2020 1.1   . Basophil% 02/06/2020 0.2   . Immature Granulocyte % 02/06/2020 0.2   . Immature Granulocyte Cou* 02/06/2020  0.01   . Cholesterol, Total 02/06/2020 127   . Triglyceride 02/06/2020 88   . HDL (High Density Lipopr* 02/06/2020 47.3   . LDL Calculated 02/06/2020 62   . VLDL Cholesterol 02/06/2020 18   . Cholesterol/HDL Ratio 02/06/2020 2.7   . HCV Ab - [facility] 02/06/2020 <0.1   . Chlamydia trachomatis, N* 02/06/2020 Negative   . Neisseria gonorrhoeae by* 02/06/2020 Negative   . HIV 1/2 Antibodies 02/06/2020 Negative   . HIV 1 P24 Antigen 02/06/2020 Negative   . RPR - [facility] 02/06/2020 Non Reactive   . Comment: - [facility] 02/06/2020 Comment      ASSESSMENT: Clinton Ramirez is a 24 y.o. male presenting for consultation for bilateral inguinal hernia.    The patient presents with a symptomatic, reducible inguinal hernia. Patient was oriented about the diagnosis of inguinal hernia and its implication. The patient was oriented about the treatment alternatives (observation vs surgical repair). Due to patient symptoms, repair is recommended. Patient oriented about the surgical procedure, the use of mesh and its risk of complications such as: infection, bleeding, injury to vas deference, vasculature and testicle, injury to bowel or bladder, and chronic pain.  Patient was also  oriented about sports hernia which is tender to hernia but more a weakening of the pelvic floor and inflammation of the tendons and muscles that attach to the pelvic bones.  Both the fat-containing inguinal hernias and the sports hernia are treated with laparoscopic inguinal hernia repair with mesh.  This was discussed with the patient.  The patient reports understood and agreed to proceed.  Non-recurrent bilateral inguinal hernia without obstruction or gangrene [K40.20]  PLAN: 1. Robotic assisted laparoscopic bilateral inguinal hernia repair with mesh (59747 x2) 2.  Avoid taking aspirin 5 days before procedure 3.  Contact us if has any question or concern.   Patient and her mother verbalized understanding, all questions were answered, and were agreeable with the plan outlined above.    Clinton Pun, MD  Electronically signed by Clinton Pun, MD

## 2020-04-23 ENCOUNTER — Other Ambulatory Visit: Payer: 59

## 2020-04-23 ENCOUNTER — Other Ambulatory Visit: Payer: Self-pay

## 2020-04-23 ENCOUNTER — Encounter
Admission: RE | Admit: 2020-04-23 | Discharge: 2020-04-23 | Disposition: A | Discharge status: Home / Self Care | Payer: 59 | Source: Ambulatory Visit | Attending: General Surgery | Admitting: General Surgery

## 2020-04-23 DIAGNOSIS — Z01818 Encounter for other preprocedural examination: Secondary | ICD-10-CM | POA: Insufficient documentation

## 2020-04-23 DIAGNOSIS — Z20822 Contact with and (suspected) exposure to covid-19: Secondary | ICD-10-CM | POA: Diagnosis not present

## 2020-04-23 HISTORY — DX: Essential (primary) hypertension: I10

## 2020-04-23 NOTE — Patient Instructions (Signed)
Your procedure is scheduled on: Wed. 6/9 Report to Day Surgery. To find out your arrival time please call [phone] between 1PM - 3PM on Tues 6/8  Remember: Instructions that are not followed completely may result in serious medical risk,  up to and including death, or upon the discretion of your surgeon and anesthesiologist your  surgery may need to be rescheduled.     _X__ 1. Do not eat food after midnight the night before your procedure.                 No gum chewing or hard candies. You may drink clear liquids up to 2 hours                 before you are scheduled to arrive for your surgery- DO not drink clear                 liquids within 2 hours of the start of your surgery.                 Clear Liquids include:  water, apple juice without pulp, clear Gatorade, G2 or                  Gatorade Zero (avoid Red/Purple/Blue), Black Coffee or Tea (Do not add                 anything to coffee or tea). _____2.   Complete the carbohydrate drink provided to you, 2 hours before arrival.  __X__2.  On the morning of surgery brush your teeth with toothpaste and water, you                may rinse your mouth with mouthwash if you wish.  Do not swallow any toothpaste of mouthwash.     _X__ 3.  No Alcohol for 24 hours before or after surgery.   _X__ 4.  Do Not Smoke or use e-cigarettes For 24 Hours Prior to Your Surgery.                 Do not use any chewable tobacco products for at least 6 hours prior to                 Surgery.  _X__  5.  Do not use any recreational drugs (marijuana, cocaine, heroin, ecstacy, MDMA or other)                For at least one week prior to your surgery.  Combination of these drugs with anesthesia                May have life threatening results.  ____  6.  Bring all medications with you on the day of surgery if instructed.   _x___  7.  Notify your doctor if there is any change in your medical condition      (cold, fever,  infections).     Do not wear jewelry, make-up, hairpins, clips or nail polish. Do not wear lotions, powders, or perfumes. You may wear deodorant. Do not shave 48 hours prior to surgery. Men may shave face and neck. Do not bring valuables to the hospital.    [facility] is not responsible for any belongings or valuables.  Contacts, dentures or bridgework may not be worn into surgery. Leave your suitcase in the car. After surgery it may be brought to your room. For patients admitted to the hospital, discharge time is determined by your treatment  team.   Patients discharged the day of surgery will not be allowed to drive home.   Make arrangements for someone to be with you for the first 24 hours of your Same Day Discharge.    Please read over the following fact sheets that you were given:    __x__ Take these medicines the morning of surgery with A SIP OF WATER:    1.escitalopram (LEXAPRO) 10 MG tablet  2.   3.   4.  5.  6.  ____ Fleet Enema (as directed)   ___x_ Use CHG Soap (or wipes) as directed  ____ Use Benzoyl Peroxide Gel as instructed  ____ Use inhalers on the day of surgery  ____ Stop metformin 2 days prior to surgery    ____ Take 1/2 of usual insulin dose the night before surgery. No insulin the morning          of surgery.   ____ Stop Coumadin/Plavix/aspirin   __x__ Stop Anti-inflammatories no ibuprofen aleve or aspirin until after the surgery   May take tylenol   __x__ Stop supplements until after surgery.  Ascorbic Acid (VITAMIN C GUMMIE PO)  ____ Bring C-Pap to the hospital.

## 2020-04-24 ENCOUNTER — Encounter
Admission: RE | Admit: 2020-04-24 | Discharge: 2020-04-24 | Disposition: A | Discharge status: Home / Self Care | Payer: 59 | Source: Ambulatory Visit | Attending: General Surgery | Admitting: General Surgery

## 2020-04-24 ENCOUNTER — Other Ambulatory Visit: Payer: 59

## 2020-04-24 DIAGNOSIS — Z20822 Contact with and (suspected) exposure to covid-19: Secondary | ICD-10-CM | POA: Insufficient documentation

## 2020-04-24 DIAGNOSIS — I10 Essential (primary) hypertension: Secondary | ICD-10-CM | POA: Insufficient documentation

## 2020-04-24 DIAGNOSIS — Z01818 Encounter for other preprocedural examination: Secondary | ICD-10-CM | POA: Insufficient documentation

## 2020-04-24 NOTE — Pre-Procedure Instructions (Signed)
Dr. Priscella Mann reviewed EKG.  May proceed with surgery.

## 2020-04-25 ENCOUNTER — Other Ambulatory Visit: Payer: Self-pay

## 2020-04-25 ENCOUNTER — Encounter: Payer: Self-pay | Admitting: General Surgery

## 2020-04-25 ENCOUNTER — Encounter
Admission: RE | Disposition: A | Discharge status: Home / Self Care | Payer: Self-pay | Source: Home / Self Care | Attending: General Surgery

## 2020-04-25 ENCOUNTER — Ambulatory Visit: Payer: 59 | Admitting: Anesthesiology

## 2020-04-25 ENCOUNTER — Ambulatory Visit
Admission: RE | Admit: 2020-04-25 | Discharge: 2020-04-25 | Disposition: A | Discharge status: Home / Self Care | Payer: 59 | Attending: General Surgery | Admitting: General Surgery

## 2020-04-25 DIAGNOSIS — Z79899 Other long term (current) drug therapy: Secondary | ICD-10-CM | POA: Diagnosis not present

## 2020-04-25 DIAGNOSIS — K4020 Bilateral inguinal hernia, without obstruction or gangrene, not specified as recurrent: Secondary | ICD-10-CM | POA: Insufficient documentation

## 2020-04-25 DIAGNOSIS — I10 Essential (primary) hypertension: Secondary | ICD-10-CM | POA: Insufficient documentation

## 2020-04-25 DIAGNOSIS — F411 Generalized anxiety disorder: Secondary | ICD-10-CM | POA: Diagnosis not present

## 2020-04-25 HISTORY — PX: XI ROBOTIC ASSISTED INGUINAL HERNIA REPAIR WITH MESH: SHX6706

## 2020-04-25 LAB — SARS CORONAVIRUS 2 (TAT 6-24 HRS): SARS Coronavirus 2: NEGATIVE

## 2020-04-25 SURGERY — REPAIR, HERNIA, INGUINAL, ROBOT-ASSISTED, LAPAROSCOPIC, USING MESH
Anesthesia: General | Site: Inguinal | Laterality: Bilateral

## 2020-04-25 MED ORDER — ONDANSETRON HCL 4 MG/2ML IJ SOLN
INTRAMUSCULAR | Status: DC | PRN
  Administered 2020-04-25: 4 mg via INTRAVENOUS

## 2020-04-25 MED ORDER — SODIUM CHLORIDE FLUSH 0.9 % IV SOLN
INTRAVENOUS | Status: AC
  Filled 2020-04-25: qty 10

## 2020-04-25 MED ORDER — CEFAZOLIN SODIUM-DEXTROSE 2-4 GM/100ML-% IV SOLN
INTRAVENOUS | Status: AC
  Filled 2020-04-25: qty 100

## 2020-04-25 MED ORDER — LACTATED RINGERS IV SOLN: INTRAVENOUS | Status: DC

## 2020-04-25 MED ORDER — PROPOFOL 10 MG/ML IV BOLUS
INTRAVENOUS | Status: DC | PRN
  Administered 2020-04-25: 200 mg via INTRAVENOUS

## 2020-04-25 MED ORDER — CHLORHEXIDINE GLUCONATE 0.12 % MT SOLN: 15.0000 mL | Freq: Once | OROMUCOSAL | Status: AC

## 2020-04-25 MED ORDER — FENTANYL CITRATE (PF) 100 MCG/2ML IJ SOLN
INTRAMUSCULAR | Status: DC | PRN
  Administered 2020-04-25 (×3): 50 ug via INTRAVENOUS
  Administered 2020-04-25: 100 ug via INTRAVENOUS

## 2020-04-25 MED ORDER — ONDANSETRON HCL 4 MG/2ML IJ SOLN
INTRAMUSCULAR | Status: AC
  Administered 2020-04-25: 4 mg via INTRAVENOUS
  Filled 2020-04-25: qty 2

## 2020-04-25 MED ORDER — LIDOCAINE HCL (PF) 2 % IJ SOLN
INTRAMUSCULAR | Status: AC
  Filled 2020-04-25: qty 5

## 2020-04-25 MED ORDER — DEXAMETHASONE SODIUM PHOSPHATE 10 MG/ML IJ SOLN
INTRAMUSCULAR | Status: AC
  Filled 2020-04-25: qty 1

## 2020-04-25 MED ORDER — SUGAMMADEX SODIUM 200 MG/2ML IV SOLN
INTRAVENOUS | Status: DC | PRN
  Administered 2020-04-25: 200 mg via INTRAVENOUS

## 2020-04-25 MED ORDER — FAMOTIDINE 20 MG PO TABS: 20.0000 mg | ORAL_TABLET | Freq: Once | ORAL | Status: AC

## 2020-04-25 MED ORDER — BUPIVACAINE-EPINEPHRINE 0.25% -1:200000 IJ SOLN
INTRAMUSCULAR | Status: DC | PRN
  Administered 2020-04-25: 30 mL

## 2020-04-25 MED ORDER — BUPIVACAINE HCL (PF) 0.25 % IJ SOLN
INTRAMUSCULAR | Status: AC
  Filled 2020-04-25: qty 30

## 2020-04-25 MED ORDER — LIDOCAINE HCL (CARDIAC) PF 100 MG/5ML IV SOSY
PREFILLED_SYRINGE | INTRAVENOUS | Status: DC | PRN
  Administered 2020-04-25: 50 mg via INTRAVENOUS

## 2020-04-25 MED ORDER — ROCURONIUM BROMIDE 100 MG/10ML IV SOLN
INTRAVENOUS | Status: DC | PRN
  Administered 2020-04-25: 10 mg via INTRAVENOUS
  Administered 2020-04-25: 50 mg via INTRAVENOUS
  Administered 2020-04-25 (×2): 10 mg via INTRAVENOUS

## 2020-04-25 MED ORDER — KETOROLAC TROMETHAMINE 30 MG/ML IJ SOLN
INTRAMUSCULAR | Status: AC
  Filled 2020-04-25: qty 1

## 2020-04-25 MED ORDER — GLYCOPYRROLATE 0.2 MG/ML IJ SOLN
INTRAMUSCULAR | Status: DC | PRN
  Administered 2020-04-25: .2 mg via INTRAVENOUS

## 2020-04-25 MED ORDER — ACETAMINOPHEN 10 MG/ML IV SOLN
INTRAVENOUS | Status: AC
  Filled 2020-04-25: qty 100

## 2020-04-25 MED ORDER — DEXMEDETOMIDINE HCL IN NACL 200 MCG/50ML IV SOLN
INTRAVENOUS | Status: DC | PRN
  Administered 2020-04-25 (×2): 12 ug via INTRAVENOUS

## 2020-04-25 MED ORDER — CEFAZOLIN SODIUM-DEXTROSE 2-4 GM/100ML-% IV SOLN
2.0000 g | INTRAVENOUS | Status: AC
  Administered 2020-04-25: 2 g via INTRAVENOUS

## 2020-04-25 MED ORDER — FENTANYL CITRATE (PF) 250 MCG/5ML IJ SOLN
INTRAMUSCULAR | Status: AC
  Filled 2020-04-25: qty 5

## 2020-04-25 MED ORDER — EPINEPHRINE PF 1 MG/ML IJ SOLN
INTRAMUSCULAR | Status: AC
  Filled 2020-04-25: qty 1

## 2020-04-25 MED ORDER — DEXAMETHASONE SODIUM PHOSPHATE 10 MG/ML IJ SOLN
INTRAMUSCULAR | Status: DC | PRN
  Administered 2020-04-25: 10 mg via INTRAVENOUS

## 2020-04-25 MED ORDER — MIDAZOLAM HCL 2 MG/2ML IJ SOLN
INTRAMUSCULAR | Status: AC
  Filled 2020-04-25: qty 2

## 2020-04-25 MED ORDER — ROCURONIUM BROMIDE 10 MG/ML (PF) SYRINGE
PREFILLED_SYRINGE | INTRAVENOUS | Status: AC
  Filled 2020-04-25: qty 10

## 2020-04-25 MED ORDER — ACETAMINOPHEN 10 MG/ML IV SOLN
INTRAVENOUS | Status: DC | PRN
  Administered 2020-04-25: 1000 mg via INTRAVENOUS

## 2020-04-25 MED ORDER — CHLORHEXIDINE GLUCONATE 0.12 % MT SOLN
OROMUCOSAL | Status: AC
  Administered 2020-04-25: 15 mL via OROMUCOSAL
  Filled 2020-04-25: qty 15

## 2020-04-25 MED ORDER — FENTANYL CITRATE (PF) 100 MCG/2ML IJ SOLN: 25.0000 ug | INTRAMUSCULAR | Status: DC | PRN

## 2020-04-25 MED ORDER — ORAL CARE MOUTH RINSE: 15.0000 mL | Freq: Once | OROMUCOSAL | Status: AC

## 2020-04-25 MED ORDER — GLYCOPYRROLATE 0.2 MG/ML IJ SOLN
INTRAMUSCULAR | Status: AC
  Filled 2020-04-25: qty 1

## 2020-04-25 MED ORDER — HYDROCODONE-ACETAMINOPHEN 5-325 MG PO TABS: 1.0000 | ORAL_TABLET | ORAL | 0 refills | Status: AC | PRN

## 2020-04-25 MED ORDER — ONDANSETRON HCL 4 MG/2ML IJ SOLN
INTRAMUSCULAR | Status: AC
  Filled 2020-04-25: qty 2

## 2020-04-25 MED ORDER — PROPOFOL 10 MG/ML IV BOLUS
INTRAVENOUS | Status: AC
  Filled 2020-04-25: qty 20

## 2020-04-25 MED ORDER — FAMOTIDINE 20 MG PO TABS
ORAL_TABLET | ORAL | Status: AC
  Administered 2020-04-25: 20 mg via ORAL
  Filled 2020-04-25: qty 1

## 2020-04-25 MED ORDER — MIDAZOLAM HCL 2 MG/2ML IJ SOLN
INTRAMUSCULAR | Status: DC | PRN
  Administered 2020-04-25: 2 mg via INTRAVENOUS

## 2020-04-25 MED ORDER — DEXMEDETOMIDINE HCL IN NACL 80 MCG/20ML IV SOLN
INTRAVENOUS | Status: AC
  Filled 2020-04-25: qty 20

## 2020-04-25 MED ORDER — ONDANSETRON HCL 4 MG/2ML IJ SOLN: 4.0000 mg | Freq: Once | INTRAMUSCULAR | Status: AC | PRN

## 2020-04-25 MED ORDER — KETOROLAC TROMETHAMINE 30 MG/ML IJ SOLN
INTRAMUSCULAR | Status: DC | PRN
Start: 2020-04-25 — End: 2020-04-25
  Administered 2020-04-25: 30 mg via INTRAVENOUS

## 2020-04-25 SURGICAL SUPPLY — 56 items
APPLICATOR CHLORAPREP 10 TEAL (MISCELLANEOUS) IMPLANT
BAG INFUSER PRESSURE 100CC (MISCELLANEOUS) IMPLANT
BLADE SURG SZ11 CARB STEEL (BLADE) ×3 IMPLANT
CANISTER SUCT 1200ML W/VALVE (MISCELLANEOUS) ×3 IMPLANT
CHLORAPREP W/TINT 26 (MISCELLANEOUS) ×3 IMPLANT
COVER TIP SHEARS 8 DVNC (MISCELLANEOUS) ×1 IMPLANT
COVER TIP SHEARS 8MM DA VINCI (MISCELLANEOUS) ×2
COVER WAND RF STERILE (DRAPES) ×3 IMPLANT
DEFOGGER SCOPE WARMER CLEARIFY (MISCELLANEOUS) ×3 IMPLANT
DERMABOND ADVANCED (GAUZE/BANDAGES/DRESSINGS) ×2
DERMABOND ADVANCED .7 DNX12 (GAUZE/BANDAGES/DRESSINGS) ×1 IMPLANT
DRAPE ARM DVNC X/XI (DISPOSABLE) ×3 IMPLANT
DRAPE COLUMN DVNC XI (DISPOSABLE) ×1 IMPLANT
DRAPE DA VINCI XI ARM (DISPOSABLE) ×6
DRAPE DA VINCI XI COLUMN (DISPOSABLE) ×2
ELECT REM PT RETURN 9FT ADLT (ELECTROSURGICAL) ×3
ELECTRODE REM PT RTRN 9FT ADLT (ELECTROSURGICAL) ×1 IMPLANT
GLOVE BIO SURGEON STRL SZ 6.5 (GLOVE) ×4 IMPLANT
GLOVE BIO SURGEONS STRL SZ 6.5 (GLOVE) ×2
GLOVE BIOGEL PI IND STRL 6.5 (GLOVE) ×2 IMPLANT
GLOVE BIOGEL PI INDICATOR 6.5 (GLOVE) ×4
GOWN STRL REUS W/ TWL LRG LVL3 (GOWN DISPOSABLE) ×5 IMPLANT
GOWN STRL REUS W/TWL LRG LVL3 (GOWN DISPOSABLE) ×10
IRRIGATOR SUCT 8 DISP DVNC XI (IRRIGATION / IRRIGATOR) IMPLANT
IRRIGATOR SUCTION 8MM XI DISP (IRRIGATION / IRRIGATOR)
IV CATH ANGIO 12GX3 LT BLUE (NEEDLE) ×3 IMPLANT
IV NS 1000ML (IV SOLUTION)
IV NS 1000ML BAXH (IV SOLUTION) IMPLANT
KIT PINK PAD W/HEAD ARE REST (MISCELLANEOUS) ×3
KIT PINK PAD W/HEAD ARM REST (MISCELLANEOUS) ×1 IMPLANT
LABEL OR SOLS (LABEL) IMPLANT
MESH 3DMAX 4X6 LT LRG (Mesh General) ×2 IMPLANT
MESH 3DMAX 4X6 RT LRG (Mesh General) ×2 IMPLANT
MESH 3DMAX MID 4X6 LT LRG (Mesh General) ×1 IMPLANT
MESH 3DMAX MID 4X6 RT LRG (Mesh General) ×1 IMPLANT
NEEDLE HYPO 22GX1.5 SAFETY (NEEDLE) ×3 IMPLANT
NEEDLE INSUFFLATION 14GA 120MM (NEEDLE) ×3 IMPLANT
OBTURATOR OPTICAL STANDARD 8MM (TROCAR) ×2
OBTURATOR OPTICAL STND 8 DVNC (TROCAR) ×1
OBTURATOR OPTICALSTD 8 DVNC (TROCAR) ×1 IMPLANT
PACK LAP CHOLECYSTECTOMY (MISCELLANEOUS) ×3 IMPLANT
SEAL CANN UNIV 5-8 DVNC XI (MISCELLANEOUS) ×3 IMPLANT
SEAL XI 5MM-8MM UNIVERSAL (MISCELLANEOUS) ×6
SET TUBE SMOKE EVAC HIGH FLOW (TUBING) ×3 IMPLANT
SOLUTION ELECTROLUBE (MISCELLANEOUS) ×3 IMPLANT
SUT MNCRL 4-0 (SUTURE) ×2
SUT MNCRL 4-0 27XMFL (SUTURE) ×1
SUT MNCRL AB 4-0 PS2 18 (SUTURE) ×3 IMPLANT
SUT V-LOC 90 ABS 3-0 VLT  V-20 (SUTURE) ×6
SUT V-LOC 90 ABS 3-0 VLT V-20 (SUTURE) ×3 IMPLANT
SUT VIC AB 2-0 SH 27 (SUTURE) ×2
SUT VIC AB 2-0 SH 27XBRD (SUTURE) ×1 IMPLANT
SUT VLOC 90 S/L VL9 GS22 (SUTURE) IMPLANT
SUTURE MNCRL 4-0 27XMF (SUTURE) ×1 IMPLANT
TAPE TRANSPORE STRL 2 31045 (GAUZE/BANDAGES/DRESSINGS) ×3 IMPLANT
TRAY FOLEY MTR SLVR 16FR STAT (SET/KITS/TRAYS/PACK) IMPLANT

## 2020-04-25 NOTE — Op Note (Signed)
Preoperative diagnosis: Bilateral inguinal hernia.   Postoperative diagnosis: Bilateral inguinal hernia.  Procedure: Robotic assisted Laparoscopic Transabdominal preperitoneal laparoscopic (TAPP) repair of Bilateral inguinal hernia.  Anesthesia: GETA  Surgeon: Dr. Hazle Quant  Wound Classification: Clean  Indications:  Patient is a 24 y.o. male developed a symptomatic bilateral inguinal hernia. Repair was indicated.  Findings: 1. Bilateral indirect Inguinal hernias identified 2. Vas deferens and cord structures identified and preserved 3. Bard 3D Max mesh used for repair 4. Adequate hemostasis.   Description of procedure: The patient was taken to the operating room and the correct side of surgery was verified. The patient was placed supine with arms tucked at the sides. After obtaining adequate anesthesia, the patient's abdomen was prepped and draped in standard sterile fashion. The patient was placed in the Trendelenburg position. A time-out was completed verifying correct patient, procedure, site, positioning, and implant(s) and/or special equipment prior to beginning this procedure. A Veress needle was placed at the umbilicus and pneumoperitoneum created with insufflation of carbon dioxide to 15 mmHg. After the Veress needle was removed, an 8-mm trocar was placed on epigastric area and the 30 angled laparoscope inserted. Two 8-mm trocars were then placed lateral to the rectus sheath under direct visualization. Both inguinal regions were inspected and the median umbilical ligament, medial umbilical ligament, and lateral umbilical fold were identified.  The robotic arms were docked. The robotic scope was inserted and the pelvic area anatomy targeted.  Left inguinal hernia identified being bigger. The peritoneum was incised with scissors along a line 5 cm above the superior edge of the hernia defect, extending from the median umbilical ligament to the anterior superior iliac spine. The  peritoneal flap was mobilized inferiorly using blunt and sharp dissection. The inferior epigastric vessels were exposed and the pubic symphysis was identified. Cooper's ligament was dissected to its junction with the iliac vein. The dissection was continued inferiorly to the iliopubic tract, with care taken to avoid injury to the femoral branch of the genitofemoral nerve and the lateral femoral cutaneous nerve. The cord structures were parietalized. The hernia was identified and reduced by gentle traction.  The indirect hernia sac and lipoma were noted mobilized from the cord structures and reduced into the peritoneal cavity.  A large piece of mesh was rolled longitudinally into a compact cylinder and passed through a trocar. The cylinder was placed along the inferior aspect of the working space and unrolled into place to completely cover the direct, indirect, and femoral spaces. The mesh was secured into place superiorly to the anterior abdominal wall and inferiorly and medially to Cooper's ligament with absorbable sutures. Care was taken to avoid the inferolateral triangles containing the iliac vessels and genital nerves. The peritoneal flap was closed over the mesh and secured with suture in similar positions of safety.  The right side was repaired using the same technique.  After ensuring adequate hemostasis, the trocars were removed and the pneumoperitoneum allowed to escape. The trocar incisions were closed using monocryl and skin adhesive dressings applied.  The patient tolerated the procedure well and was taken to the postanesthesia care unit in stable condition.   Specimen: None  Complications: None  Estimated Blood Loss: 5 mL

## 2020-04-25 NOTE — Discharge Instructions (Addendum)
  Diet: Resume home heart healthy regular diet.   Activity: No heavy lifting >20 pounds (children, pets, laundry, garbage) or strenuous activity until follow-up, but light activity and walking are encouraged. Do not drive or drink alcohol if taking narcotic pain medications.  Wound care: May shower with soapy water and pat dry (do not rub incisions), but no baths or submerging incision underwater until follow-up. (no swimming)   Medications: Resume all home medications. For mild to moderate pain: acetaminophen (Tylenol) or ibuprofen (if no kidney disease). Combining Tylenol with alcohol can substantially increase your risk of causing liver disease. Narcotic pain medications, if prescribed, can be used for severe pain, though may cause nausea, constipation, and drowsiness. Do not combine Tylenol and Norco within a 6 hour period as Norco contains Tylenol. If you do not need the narcotic pain medication, you do not need to fill the prescription.  Call office ([phone]) at any time if any questions, worsening pain, fevers/chills, bleeding, drainage from incision site, or other concerns.   AMBULATORY SURGERY  DISCHARGE INSTRUCTIONS   1) The drugs that you were given will stay in your system until tomorrow so for the next 24 hours you should not:  A) Drive an automobile B) Make any legal decisions C) Drink any alcoholic beverage   2) You may resume regular meals tomorrow.  Today it is better to start with liquids and gradually work up to solid foods.  You may eat anything you prefer, but it is better to start with liquids, then soup and crackers, and gradually work up to solid foods.   3) Please notify your doctor immediately if you have any unusual bleeding, trouble breathing, redness and pain at the surgery site, drainage, fever, or pain not relieved by medication.    4) Additional Instructions:        Please contact your physician with any problems or Same Day Surgery at  [phone], Monday through Friday 6 am to 4 pm, or [place] [facility] [place] Main number at [phone]. 

## 2020-04-25 NOTE — Transfer of Care (Signed)
Immediate Anesthesia Transfer of Care Note  Patient: Clinton Ramirez  Procedure(s) Performed: XI ROBOTIC ASSISTED INGUINAL HERNIA REPAIR WITH MESH (Bilateral Inguinal)  Patient Location: PACU  Anesthesia Type:General  Level of Consciousness: sedated  Airway & Oxygen Therapy: Patient Spontanous Breathing and Patient connected to face mask oxygen  Post-op Assessment: Report given to RN and Post -op Vital signs reviewed and stable  Post vital signs: Reviewed  Last Vitals:  Vitals Value Taken Time  BP 96/47 04/25/20 1105  Temp    Pulse 64 04/25/20 1106  Resp 13 04/25/20 1106  SpO2 100 % 04/25/20 1106  Vitals shown include unvalidated device data.  Last Pain:  Vitals:   04/25/20 0619  TempSrc: Tympanic  PainSc: 0-No pain         Complications: No apparent anesthesia complications

## 2020-04-25 NOTE — Anesthesia Postprocedure Evaluation (Signed)
Anesthesia Post Note  Patient: Clinton Ramirez  Procedure(s) Performed: XI ROBOTIC ASSISTED INGUINAL HERNIA REPAIR WITH MESH (Bilateral Inguinal)  Patient location during evaluation: PACU Anesthesia Type: General Level of consciousness: awake and alert Pain management: pain level controlled Vital Signs Assessment: post-procedure vital signs reviewed and stable Respiratory status: spontaneous breathing and respiratory function stable Cardiovascular status: stable Anesthetic complications: no     Last Vitals:  Vitals:   04/25/20 1134 04/25/20 1136  BP: (!) 107/51   Pulse: 87 86  Resp: 15 14  Temp:    SpO2: 98% 99%    Last Pain:  Vitals:   04/25/20 1136  TempSrc:   PainSc: Asleep                 Imaya Duffy K

## 2020-04-25 NOTE — Progress Notes (Signed)
   04/25/20 0700  Clinical Encounter Type  Visited With Patient  Visit Type Initial  Referral From Chaplain  Consult/Referral To Chaplain  Chaplain briefly spoke with patient as he waited for transportation to OR. Chaplain complimented patient's smile and wished him well. Very pleasant young man and a it was pleasure talking with him.

## 2020-04-25 NOTE — Anesthesia Procedure Notes (Signed)
Procedure Name: Intubation Performed by: Johnatha Zeidman, CRNA Pre-anesthesia Checklist: Patient identified, Patient being monitored, Timeout performed, Emergency Drugs available and Suction available Patient Re-evaluated:Patient Re-evaluated prior to induction Oxygen Delivery Method: Circle system utilized Preoxygenation: Pre-oxygenation with 100% oxygen Induction Type: IV induction Ventilation: Mask ventilation without difficulty Laryngoscope Size: Miller and 2 Grade View: Grade II Tube type: Oral Tube size: 7.5 mm Number of attempts: 1 Airway Equipment and Method: Stylet Placement Confirmation: ETT inserted through vocal cords under direct vision,  positive ETCO2 and breath sounds checked- equal and bilateral Secured at: 22 cm Tube secured with: Tape Dental Injury: Teeth and Oropharynx as per pre-operative assessment        

## 2020-04-25 NOTE — Interval H&P Note (Signed)
History and Physical Interval Note:  04/25/2020 6:57 AM  Clinton Ramirez  has presented today for surgery, with the diagnosis of K40.20 non recurrent bil inguinal hernia w/o obstruction or gangrene.  The various methods of treatment have been discussed with the patient and family. After consideration of risks, benefits and other options for treatment, the patient has consented to  Procedure(s): XI ROBOTIC ASSISTED INGUINAL HERNIA REPAIR WITH MESH (Bilateral) as a surgical intervention.  The patient's history has been reviewed, patient examined, no change in status, stable for surgery.  I have reviewed the patient's chart and labs.  Questions were answered to the patient's satisfaction.     Carolan Shiver

## 2020-04-25 NOTE — Anesthesia Preprocedure Evaluation (Signed)
Anesthesia Evaluation  Patient identified by MRN, date of birth, ID band Patient awake    Reviewed: Allergy & Precautions, NPO status , Patient's Chart, lab work & pertinent test results  History of Anesthesia Complications Negative for: history of anesthetic complications  Airway Mallampati: II       Dental   Pulmonary neg sleep apnea, neg COPD, Not current smoker,           Cardiovascular hypertension, Pt. on medications (-) Past MI and (-) CHF (-) dysrhythmias (-) Valvular Problems/Murmurs     Neuro/Psych neg Seizures    GI/Hepatic Neg liver ROS, neg GERD  ,  Endo/Other  neg diabetes  Renal/GU negative Renal ROS     Musculoskeletal   Abdominal   Peds  Hematology   Anesthesia Other Findings   Reproductive/Obstetrics                             Anesthesia Physical Anesthesia Plan  ASA: II  Anesthesia Plan: General   Post-op Pain Management:    Induction: Intravenous  PONV Risk Score and Plan: 2 and Ondansetron and Dexamethasone  Airway Management Planned: Oral ETT  Additional Equipment:   Intra-op Plan:   Post-operative Plan:   Informed Consent: I have reviewed the patients History and Physical, chart, labs and discussed the procedure including the risks, benefits and alternatives for the proposed anesthesia with the patient or authorized representative who has indicated his/her understanding and acceptance.       Plan Discussed with:   Anesthesia Plan Comments:         Anesthesia Quick Evaluation  

## 2020-04-26 ENCOUNTER — Encounter: Payer: Self-pay | Admitting: General Surgery
